# Patient Record
Sex: Female | Born: 1995 | Race: White | Hispanic: No | Marital: Single | Smoking: Never smoker
Health system: Southern US, Community
[De-identification: ages and names within clinical notes are randomized; demographics above are authoritative.]

---
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
## 2017-03-09 ENCOUNTER — Ambulatory Visit (INDEPENDENT_AMBULATORY_CARE_PROVIDER_SITE_OTHER): Payer: 59 | Admitting: Podiatry

## 2017-03-09 ENCOUNTER — Encounter: Payer: Self-pay | Admitting: Podiatry

## 2017-03-09 ENCOUNTER — Ambulatory Visit: Payer: 59

## 2017-03-09 DIAGNOSIS — L989 Disorder of the skin and subcutaneous tissue, unspecified: Secondary | ICD-10-CM | POA: Diagnosis not present

## 2017-03-09 DIAGNOSIS — S93401A Sprain of unspecified ligament of right ankle, initial encounter: Secondary | ICD-10-CM | POA: Diagnosis not present

## 2017-03-09 DIAGNOSIS — B070 Plantar wart: Secondary | ICD-10-CM

## 2017-03-09 NOTE — Patient Instructions (Signed)

## 2017-03-09 NOTE — Progress Notes (Signed)
Subjective:    Patient ID: Kristen FilbertShannon Fernandez, female    DOB: November 12, 1995, 21 y.o.   MRN: [id number]  HPIthis patient presents the office with a chief complaint of a painful wart noted under her right foot.  She says this wart has been present for years and has been treated previously by numerous doctors.  She says they have treated her with topical medication but have never been able to permanently  remove the lesion since she never is in one place long enough for follow-up.She says this lesion is painful walking and wearing her shoes. She presents to the office today for an evaluation of her wart with  her mother.  Her mother requests that the wart be  excised and that she will make sure her daughter  has  professional follow-up care. She also relates having an injury to her right ankle approximately 5 months ago which was evaluated and [place] in [place].  She says she still has occasional pain and discomfort at times.  She says she is not having a pain or discomfort through her right ankle as she walks.  She presents the office today for an evaluation and treatment of these 2 concerns.      Review of Systems     Objective:   Physical Exam General Appearance  Alert, conversant and in no acute stress.  Vascular  Dorsalis pedis and posterior pulses are palpable  bilaterally.  Capillary return is within normal limits  bilaterally. Temperature is within normal limits  Bilaterally.  Neurologic  Senn-Weinstein monofilament wire test within normal limits  bilaterally. Muscle power within normal limits bilaterally.  Nails Thick disfigured discolored nails with subungual debris bilaterally from hallux to fifth toes bilaterally. No evidence of bacterial infection or drainage bilaterally.  Orthopedic  No limitations of motion of motion feet bilaterally.  No crepitus or effusions noted.  No bony pathology or digital deformities noted. No evidence of any redness, swelling or pain upon range of motion of the right  ankle.  Mild palpable pain along the course of the peroneal tendon right ankle.  Skin  normotropic skin with no porokeratosis noted bilaterally.  No signs of infections or ulcers noted.  Well defined, well circumscribed plantar lesion distal to the second MPJ plantar head.  This lesion is noted to have cauliflower appearing tissue with black thrombi noted        Assessment & Plan:  Benign skin lesion  Wart right foot  S/P ankle sprain right  IE  >  Evaluation of the right ankle reveals minimal pathology.  Since there is no clinical symptoms. I believe this will continue to heal.  Wart  surgery for the removal of the wart on the plantar aspect of the right foot was performed. This patient was anesthetized with mixture of 2% lidocaine plain and 2 % lidocaine with epi. at the site of the skin lesion.  The surgical site was then washed with betadine and alcohol.  Using a punch the lesion was excised and passed off as specimen.  The surgical site was cauterized with phenol and washed with alcohol.  The site was bandaged with neosporin, sterile 2x2 and kling.  Home instructions given.  Patient returns to the office treatment room after the surgery due to excessive bleeding.  A larger compression dressing was applied to her right foot. Patient is leaving [place] , and will not be able to follow-up in this office for further treatment of the surgical site.  Return to clinic  when necessary.   Helane GuntherGregory Weslee Fogg DPM   Helane GuntherGregory Witt Plitt DPM
